# Patient Record
Sex: Female | Born: 1975 | Race: Black or African American | Hispanic: No | Marital: Single | Smoking: Former smoker
Health system: Southern US, Community
[De-identification: ages and names within clinical notes are randomized; demographics above are authoritative.]

## PROBLEM LIST (undated history)

## (undated) DIAGNOSIS — S62309A Unspecified fracture of unspecified metacarpal bone, initial encounter for closed fracture: Secondary | ICD-10-CM

## (undated) HISTORY — PX: TUBAL LIGATION: SHX77

---

## 2015-08-18 ENCOUNTER — Emergency Department (HOSPITAL_COMMUNITY)
Admission: EM | Admit: 2015-08-18 | Discharge: 2015-08-18 | Disposition: A | Discharge status: Home / Self Care | Payer: Self-pay | Attending: Emergency Medicine | Admitting: Emergency Medicine

## 2015-08-18 ENCOUNTER — Emergency Department (HOSPITAL_COMMUNITY): Payer: Self-pay

## 2015-08-18 ENCOUNTER — Encounter (HOSPITAL_COMMUNITY): Payer: Self-pay | Admitting: Emergency Medicine

## 2015-08-18 DIAGNOSIS — S62397A Other fracture of fifth metacarpal bone, left hand, initial encounter for closed fracture: Secondary | ICD-10-CM | POA: Insufficient documentation

## 2015-08-18 DIAGNOSIS — Y998 Other external cause status: Secondary | ICD-10-CM | POA: Insufficient documentation

## 2015-08-18 DIAGNOSIS — F17200 Nicotine dependence, unspecified, uncomplicated: Secondary | ICD-10-CM | POA: Insufficient documentation

## 2015-08-18 DIAGNOSIS — S52501A Unspecified fracture of the lower end of right radius, initial encounter for closed fracture: Secondary | ICD-10-CM

## 2015-08-18 DIAGNOSIS — S52591A Other fractures of lower end of right radius, initial encounter for closed fracture: Secondary | ICD-10-CM | POA: Insufficient documentation

## 2015-08-18 DIAGNOSIS — S62307A Unspecified fracture of fifth metacarpal bone, left hand, initial encounter for closed fracture: Secondary | ICD-10-CM

## 2015-08-18 DIAGNOSIS — Y9289 Other specified places as the place of occurrence of the external cause: Secondary | ICD-10-CM | POA: Insufficient documentation

## 2015-08-18 DIAGNOSIS — Y9389 Activity, other specified: Secondary | ICD-10-CM | POA: Insufficient documentation

## 2015-08-18 DIAGNOSIS — W1839XA Other fall on same level, initial encounter: Secondary | ICD-10-CM | POA: Insufficient documentation

## 2015-08-18 MED ORDER — HYDROCODONE-ACETAMINOPHEN 5-325 MG PO TABS: 1.0000 | ORAL_TABLET | ORAL | Status: AC | PRN

## 2015-08-18 MED ORDER — HYDROCODONE-ACETAMINOPHEN 5-325 MG PO TABS
1.0000 | ORAL_TABLET | Freq: Once | ORAL | Status: AC
Start: 2015-08-18 — End: 2015-08-18
  Administered 2015-08-18: 1 via ORAL
  Filled 2015-08-18: qty 1

## 2015-08-18 MED ORDER — NAPROXEN 250 MG PO TABS
500.0000 mg | ORAL_TABLET | Freq: Once | ORAL | Status: AC
  Administered 2015-08-18: 500 mg via ORAL
  Filled 2015-08-18: qty 2

## 2015-08-18 MED ORDER — NAPROXEN 500 MG PO TABS: 500.0000 mg | ORAL_TABLET | Freq: Two times a day (BID) | ORAL | Status: AC

## 2015-08-18 NOTE — ED Provider Notes (Signed)
CSN: [id number]     Arrival date & time 08/18/15  1331 History  By signing my name below, I, Deborah Townsend, attest that this documentation has been prepared under the direction and in the presence of Deborah Bert, PA-C. Electronically Signed: Octavia Townsend, ED Scribe. 08/18/2015. 2:55 PM.    Chief Complaint  Patient presents with  . Wrist Pain  . Hand Pain      The history is provided by the patient. No language interpreter was used.   HPI Comments: Deborah Townsend is a 40 y.o. female who presents to the Emergency Department complaining of sudden onset, moderate, gradual worsening, left hand pain and right wrist pain with associated swelling onset 3 days ago. Pt reports she was running and fell with her arms outstretched breaking her fall. Pt expresses increased pain when turning her right wrist. She has not taken any medication to alleviate her pain. Pt has been applying ice to both of her hands to alleviate her swelling with no relief. Pt is currently wearing a right wrist brace. She denies hitting her head or loss of consciousness.  History reviewed. No pertinent past medical history. History reviewed. No pertinent past surgical history. No family history on file. Social History  Substance Use Topics  . Smoking status: Current Some Day Smoker  . Smokeless tobacco: None  . Alcohol Use: Yes     Comment: social   OB History    No data available     Review of Systems  A complete 10 system review of systems was obtained and all systems are negative except as noted in the HPI and PMH.    Allergies  Review of patient's allergies indicates no known allergies.  Home Medications   Prior to Admission medications   Not on File   Triage vitals: BP 131/84 mmHg  Pulse 73  Temp(Src) 98.2 F (36.8 C) (Oral)  Resp 20  SpO2 99%  LMP 07/19/2015 (Exact Date) Physical Exam  Constitutional: She is oriented to person, place, and time. She appears well-developed and well-nourished.  HENT:   Head: Normocephalic.  Eyes: EOM are normal.  Neck: Normal range of motion.  Pulmonary/Chest: Effort normal.  Abdominal: She exhibits no distension.  Musculoskeletal: Normal range of motion. She exhibits tenderness.  Radial side thumb through wrist ttp. Bruising of radial side of right wrist. 2+ distal pulses. Limited wrist ROM 2/2 pain. Brisk cap refill x 5  Left hand with tenderness at base of fifth finger. No discoloration. Brisk cap refill.   Neurological: She is alert and oriented to person, place, and time.  Psychiatric: She has a normal mood and affect.  Nursing note and vitals reviewed.   ED Course  Procedures  DIAGNOSTIC STUDIES: Oxygen Saturation is 99% on RA, normal by my interpretation.  COORDINATION OF CARE:  2:55 PM Discussed treatment plan with pt at bedside and pt agreed to plan.  Labs Review Labs Reviewed - No data to display  Imaging Review Dg Wrist Complete Right  08/18/2015  CLINICAL DATA:  Acute left wrist pain and swelling after fall 2 days ago. EXAM: RIGHT WRIST - COMPLETE 3+ VIEW COMPARISON:  None. FINDINGS: Minimally displaced fracture is seen involving the distal right radius with probable intra-articular extension. This appears to be closed and posttraumatic. Joint spaces appear to be intact. No other bony abnormality is noted. IMPRESSION: Minimally displaced distal right radial fracture with probable intra-articular extension. Electronically Signed   By: Lupita Raider, M.D.   On: 08/18/2015 14:47  Dg Hand Complete Left  08/18/2015  CLINICAL DATA:  Acute left hand pain and swelling after fall 2 days ago. Initial encounter. EXAM: LEFT HAND - COMPLETE 3+ VIEW COMPARISON:  None. FINDINGS: Mildly displaced and possibly comminuted oblique fracture is seen involving the fifth metacarpal. This appears to be closed and posttraumatic. No other fracture or bony abnormality is noted. Joint spaces appear to be intact. No soft tissue abnormality is noted. IMPRESSION:  Mildly displaced and possibly comminuted fifth metacarpal fracture. Electronically Signed   By: Lupita RaiderJames  Green Jr, M.D.   On: 08/18/2015 14:45   I have personally reviewed and evaluated these images and lab results as part of my medical decision-making.   EKG Interpretation None      MDM   Final diagnoses:  Fracture of fifth metacarpal bone of left hand, closed, initial encounter  Distal radial fracture, right, closed, initial encounter    X-rays reveal a left fifth metacarpal fracture and right distal radial fracture. I spoke with Dr. Mina MarbleWeingold who advised splinting both extremities and f/u in clinic with him either tomorrow or Thursday. Appreciate recs. Splints placed by ortho techs. Pain controlled in the ED. Rx given for pain meds. ER return precautions given.  I personally performed the services described in this documentation, which was scribed in my presence. The recorded information has been reviewed and is accurate.   Deborah CoriaSerena Y [name], PA-C 08/18/15 1730  Pricilla LovelessScott Goldston, MD 08/19/15 [phone]

## 2015-08-18 NOTE — ED Notes (Signed)
Ortho tech called for splints

## 2015-08-18 NOTE — Progress Notes (Signed)
Orthopedic Tech Progress Note Patient Details:  Deborah Townsend 05-13-1975 [id number]  Ortho Devices Type of Ortho Device: Ace wrap, Arm sling, Sugartong splint, Ulna gutter splint Ortho Device/Splint Location: sugartong RUE, ulna gutter LUE Ortho Device/Splint Interventions: Ordered, Application   Jennye MoccasinHughes, Cris Gibby Craig 08/18/2015, 4:03 PM

## 2015-08-18 NOTE — ED Notes (Signed)
EDP at bedside  

## 2015-08-18 NOTE — ED Notes (Signed)
Pt reports that she fell Saturday and has had right wrist pain and left hand pain since. Pt has swelling to the left hand and a brace on the right wrist.

## 2015-08-18 NOTE — Discharge Instructions (Signed)
Please call Dr. Ronie SpiesWeingold's office to schedule a follow up appointment. I will give you a couple prescriptions to help with your pain in the meantime.

## 2015-08-18 NOTE — ED Notes (Signed)
Pt. Transported to xray at this time.  

## 2015-08-18 NOTE — ED Notes (Signed)
Ortho tech in room applying splints at this time

## 2015-08-21 ENCOUNTER — Other Ambulatory Visit: Payer: Self-pay | Admitting: Orthopedic Surgery

## 2015-08-22 ENCOUNTER — Encounter (HOSPITAL_BASED_OUTPATIENT_CLINIC_OR_DEPARTMENT_OTHER): Payer: Self-pay | Admitting: *Deleted

## 2015-08-27 ENCOUNTER — Encounter (HOSPITAL_BASED_OUTPATIENT_CLINIC_OR_DEPARTMENT_OTHER)
Admission: RE | Disposition: A | Discharge status: Home / Self Care | Payer: Self-pay | Source: Ambulatory Visit | Attending: Orthopedic Surgery

## 2015-08-27 ENCOUNTER — Ambulatory Visit (HOSPITAL_BASED_OUTPATIENT_CLINIC_OR_DEPARTMENT_OTHER): Payer: MEDICAID | Admitting: Anesthesiology

## 2015-08-27 ENCOUNTER — Ambulatory Visit (HOSPITAL_BASED_OUTPATIENT_CLINIC_OR_DEPARTMENT_OTHER)
Admission: RE | Admit: 2015-08-27 | Discharge: 2015-08-27 | Disposition: A | Discharge status: Home / Self Care | Payer: Self-pay | Source: Ambulatory Visit | Attending: Orthopedic Surgery | Admitting: Orthopedic Surgery

## 2015-08-27 ENCOUNTER — Encounter (HOSPITAL_BASED_OUTPATIENT_CLINIC_OR_DEPARTMENT_OTHER): Payer: Self-pay | Admitting: Anesthesiology

## 2015-08-27 DIAGNOSIS — X58XXXA Exposure to other specified factors, initial encounter: Secondary | ICD-10-CM | POA: Insufficient documentation

## 2015-08-27 DIAGNOSIS — Z87891 Personal history of nicotine dependence: Secondary | ICD-10-CM | POA: Insufficient documentation

## 2015-08-27 DIAGNOSIS — S62307A Unspecified fracture of fifth metacarpal bone, left hand, initial encounter for closed fracture: Secondary | ICD-10-CM | POA: Insufficient documentation

## 2015-08-27 HISTORY — PX: OPEN REDUCTION INTERNAL FIXATION (ORIF) METACARPAL: SHX6234

## 2015-08-27 HISTORY — DX: Unspecified fracture of unspecified metacarpal bone, initial encounter for closed fracture: S62.309A

## 2015-08-27 SURGERY — OPEN REDUCTION INTERNAL FIXATION (ORIF) METACARPAL
Anesthesia: General | Site: Hand | Laterality: Left

## 2015-08-27 MED ORDER — CEFAZOLIN SODIUM-DEXTROSE 2-4 GM/100ML-% IV SOLN
INTRAVENOUS | Status: AC
  Filled 2015-08-27: qty 100

## 2015-08-27 MED ORDER — SCOPOLAMINE 1 MG/3DAYS TD PT72
1.0000 | MEDICATED_PATCH | Freq: Once | TRANSDERMAL | Status: DC | PRN
Start: 2015-08-27 — End: 2015-08-27

## 2015-08-27 MED ORDER — FENTANYL CITRATE (PF) 100 MCG/2ML IJ SOLN
INTRAMUSCULAR | Status: DC | PRN
  Administered 2015-08-27 (×6): 50 ug via INTRAVENOUS

## 2015-08-27 MED ORDER — FENTANYL CITRATE (PF) 100 MCG/2ML IJ SOLN
INTRAMUSCULAR | Status: AC
  Filled 2015-08-27: qty 2

## 2015-08-27 MED ORDER — DEXAMETHASONE SODIUM PHOSPHATE 4 MG/ML IJ SOLN
INTRAMUSCULAR | Status: DC | PRN
  Administered 2015-08-27: 10 mg via INTRAVENOUS

## 2015-08-27 MED ORDER — MIDAZOLAM HCL 5 MG/5ML IJ SOLN
INTRAMUSCULAR | Status: DC | PRN
  Administered 2015-08-27: 2 mg via INTRAVENOUS

## 2015-08-27 MED ORDER — FENTANYL CITRATE (PF) 100 MCG/2ML IJ SOLN
25.0000 ug | INTRAMUSCULAR | Status: DC | PRN
  Administered 2015-08-27 (×2): 50 ug via INTRAVENOUS

## 2015-08-27 MED ORDER — PROMETHAZINE HCL 25 MG/ML IJ SOLN: 6.2500 mg | INTRAMUSCULAR | Status: DC | PRN

## 2015-08-27 MED ORDER — FENTANYL CITRATE (PF) 100 MCG/2ML IJ SOLN: 50.0000 ug | INTRAMUSCULAR | Status: DC | PRN

## 2015-08-27 MED ORDER — CEFAZOLIN SODIUM-DEXTROSE 2-4 GM/100ML-% IV SOLN
2.0000 g | INTRAVENOUS | Status: AC
  Administered 2015-08-27: 2 g via INTRAVENOUS

## 2015-08-27 MED ORDER — DEXAMETHASONE SODIUM PHOSPHATE 10 MG/ML IJ SOLN
INTRAMUSCULAR | Status: AC
  Filled 2015-08-27: qty 1

## 2015-08-27 MED ORDER — OXYCODONE-ACETAMINOPHEN 5-325 MG PO TABS: 1.0000 | ORAL_TABLET | ORAL | Status: AC | PRN

## 2015-08-27 MED ORDER — MIDAZOLAM HCL 2 MG/2ML IJ SOLN: 1.0000 mg | INTRAMUSCULAR | Status: DC | PRN

## 2015-08-27 MED ORDER — OXYCODONE HCL 5 MG PO TABS
ORAL_TABLET | ORAL | Status: AC
  Filled 2015-08-27: qty 1

## 2015-08-27 MED ORDER — LIDOCAINE 2% (20 MG/ML) 5 ML SYRINGE
INTRAMUSCULAR | Status: AC
  Filled 2015-08-27: qty 5

## 2015-08-27 MED ORDER — LIDOCAINE HCL (PF) 1 % IJ SOLN
INTRAMUSCULAR | Status: AC
  Filled 2015-08-27: qty 30

## 2015-08-27 MED ORDER — ONDANSETRON HCL 4 MG/2ML IJ SOLN
INTRAMUSCULAR | Status: AC
  Filled 2015-08-27: qty 2

## 2015-08-27 MED ORDER — ONDANSETRON HCL 4 MG/2ML IJ SOLN
INTRAMUSCULAR | Status: DC | PRN
  Administered 2015-08-27: 4 mg via INTRAVENOUS

## 2015-08-27 MED ORDER — BUPIVACAINE HCL (PF) 0.25 % IJ SOLN
INTRAMUSCULAR | Status: AC
  Filled 2015-08-27: qty 30

## 2015-08-27 MED ORDER — GLYCOPYRROLATE 0.2 MG/ML IJ SOLN: 0.2000 mg | Freq: Once | INTRAMUSCULAR | Status: DC | PRN

## 2015-08-27 MED ORDER — PROPOFOL 10 MG/ML IV BOLUS
INTRAVENOUS | Status: AC
  Filled 2015-08-27: qty 40

## 2015-08-27 MED ORDER — LIDOCAINE HCL (CARDIAC) 20 MG/ML IV SOLN
INTRAVENOUS | Status: DC | PRN
  Administered 2015-08-27: 100 mg via INTRAVENOUS

## 2015-08-27 MED ORDER — MIDAZOLAM HCL 2 MG/2ML IJ SOLN
INTRAMUSCULAR | Status: AC
  Filled 2015-08-27: qty 2

## 2015-08-27 MED ORDER — LACTATED RINGERS IV SOLN
INTRAVENOUS | Status: DC
  Administered 2015-08-27 (×2): via INTRAVENOUS

## 2015-08-27 MED ORDER — BUPIVACAINE HCL (PF) 0.25 % IJ SOLN
INTRAMUSCULAR | Status: DC | PRN
  Administered 2015-08-27: 10 mL

## 2015-08-27 MED ORDER — CHLORHEXIDINE GLUCONATE 4 % EX LIQD: 60.0000 mL | Freq: Once | CUTANEOUS | Status: DC

## 2015-08-27 MED ORDER — OXYCODONE HCL 5 MG PO TABS
5.0000 mg | ORAL_TABLET | Freq: Once | ORAL | Status: AC
  Administered 2015-08-27: 5 mg via ORAL

## 2015-08-27 MED ORDER — PROPOFOL 10 MG/ML IV BOLUS
INTRAVENOUS | Status: DC | PRN
  Administered 2015-08-27: 150 mg via INTRAVENOUS
  Administered 2015-08-27: 50 mg via INTRAVENOUS

## 2015-08-27 SURGICAL SUPPLY — 76 items
APL SKNCLS STERI-STRIP NONHPOA (GAUZE/BANDAGES/DRESSINGS) ×1
BANDAGE ACE 3X5.8 VEL STRL LF (GAUZE/BANDAGES/DRESSINGS) ×3 IMPLANT
BANDAGE ACE 4X5 VEL STRL LF (GAUZE/BANDAGES/DRESSINGS) IMPLANT
BENZOIN TINCTURE PRP APPL 2/3 (GAUZE/BANDAGES/DRESSINGS) ×3 IMPLANT
BIT DRILL 1.1 MINI (BIT) ×1 IMPLANT
BLADE SURG 15 STRL LF DISP TIS (BLADE) ×1 IMPLANT
BLADE SURG 15 STRL SS (BLADE) ×3
BNDG CMPR 9X4 STRL LF SNTH (GAUZE/BANDAGES/DRESSINGS) ×1
BNDG ELASTIC 2X5.8 VLCR STR LF (GAUZE/BANDAGES/DRESSINGS) ×3 IMPLANT
BNDG ESMARK 4X9 LF (GAUZE/BANDAGES/DRESSINGS) ×3 IMPLANT
BNDG GAUZE ELAST 4 BULKY (GAUZE/BANDAGES/DRESSINGS) ×3 IMPLANT
CANISTER SUCT 1200ML W/VALVE (MISCELLANEOUS) IMPLANT
CLOSURE WOUND 1/2 X4 (GAUZE/BANDAGES/DRESSINGS) ×1
CORDS BIPOLAR (ELECTRODE) ×3 IMPLANT
COVER BACK TABLE 60X90IN (DRAPES) ×3 IMPLANT
CUFF TOURNIQUET SINGLE 18IN (TOURNIQUET CUFF) ×3 IMPLANT
DECANTER SPIKE VIAL GLASS SM (MISCELLANEOUS) IMPLANT
DRAPE EXTREMITY T 121X128X90 (DRAPE) ×3 IMPLANT
DRAPE OEC MINIVIEW 54X84 (DRAPES) ×3 IMPLANT
DRAPE SURG 17X23 STRL (DRAPES) ×3 IMPLANT
DRILL BIT 1.1 MINI (BIT) ×3
DURAPREP 26ML APPLICATOR (WOUND CARE) ×3 IMPLANT
GAUZE SPONGE 4X4 12PLY STRL (GAUZE/BANDAGES/DRESSINGS) ×3 IMPLANT
GAUZE SPONGE 4X4 16PLY XRAY LF (GAUZE/BANDAGES/DRESSINGS) IMPLANT
GAUZE XEROFORM 1X8 LF (GAUZE/BANDAGES/DRESSINGS) IMPLANT
GLOVE BIOGEL PI IND STRL 7.0 (GLOVE) ×1 IMPLANT
GLOVE BIOGEL PI IND STRL 7.5 (GLOVE) ×1 IMPLANT
GLOVE BIOGEL PI INDICATOR 7.0 (GLOVE) ×2
GLOVE BIOGEL PI INDICATOR 7.5 (GLOVE) ×2
GLOVE ECLIPSE 6.5 STRL STRAW (GLOVE) ×3 IMPLANT
GLOVE SURG SYN 8.0 (GLOVE) ×3 IMPLANT
GOWN STRL REUS W/ TWL LRG LVL3 (GOWN DISPOSABLE) ×1 IMPLANT
GOWN STRL REUS W/TWL LRG LVL3 (GOWN DISPOSABLE) ×3
GOWN STRL REUS W/TWL XL LVL3 (GOWN DISPOSABLE) ×6 IMPLANT
NEEDLE HYPO 25X1 1.5 SAFETY (NEEDLE) ×3 IMPLANT
NS IRRIG 1000ML POUR BTL (IV SOLUTION) ×3 IMPLANT
PACK BASIN DAY SURGERY FS (CUSTOM PROCEDURE TRAY) ×3 IMPLANT
PAD CAST 3X4 CTTN HI CHSV (CAST SUPPLIES) ×1 IMPLANT
PAD CAST 4YDX4 CTTN HI CHSV (CAST SUPPLIES) IMPLANT
PADDING CAST ABS 4INX4YD NS (CAST SUPPLIES) ×2
PADDING CAST ABS COTTON 4X4 ST (CAST SUPPLIES) ×1 IMPLANT
PADDING CAST COTTON 3X4 STRL (CAST SUPPLIES) ×3
PADDING CAST COTTON 4X4 STRL (CAST SUPPLIES)
PADDING UNDERCAST 2 STRL (CAST SUPPLIES) ×2
PADDING UNDERCAST 2X4 STRL (CAST SUPPLIES) ×1 IMPLANT
PLATE T 1.5 3H HD/8H SFT (Plate) ×1 IMPLANT
PLATE-T 1.5 3H HD/8H SFT (Plate) ×3 IMPLANT
SCREW CORTEX 1.5X10 (Screw) ×3 IMPLANT
SCREW CORTEX 1.5X11 (Screw) ×3 IMPLANT
SCREW CORTEX 1.5X12 (Screw) ×6 IMPLANT
SCREW CORTEX 1.5X14 (Screw) ×6 IMPLANT
SCREW SELF TAP CORTEX 1.5 13MM (Screw) ×3 IMPLANT
SHEET MEDIUM DRAPE 40X70 STRL (DRAPES) ×3 IMPLANT
SLEEVE SCD COMPRESS KNEE MED (MISCELLANEOUS) ×3 IMPLANT
SPLINT PLASTER CAST XFAST 4X15 (CAST SUPPLIES) ×15 IMPLANT
SPLINT PLASTER XTRA FAST SET 4 (CAST SUPPLIES) ×30
STOCKINETTE 4X48 STRL (DRAPES) ×3 IMPLANT
STRIP CLOSURE SKIN 1/2X4 (GAUZE/BANDAGES/DRESSINGS) ×2 IMPLANT
SUCTION FRAZIER HANDLE 10FR (MISCELLANEOUS)
SUCTION TUBE FRAZIER 10FR DISP (MISCELLANEOUS) IMPLANT
SUT ETHILON 4 0 PS 2 18 (SUTURE) IMPLANT
SUT ETHILON 5 0 PS 2 18 (SUTURE) IMPLANT
SUT MERSILENE 4 0 P 3 (SUTURE) IMPLANT
SUT VIC AB 2-0 CT3 27 (SUTURE) ×3 IMPLANT
SUT VIC AB 4-0 P-3 18XBRD (SUTURE) IMPLANT
SUT VIC AB 4-0 P3 18 (SUTURE)
SUT VICRYL 3-0 RB1 (SUTURE) IMPLANT
SUT VICRYL 4-0 PS2 18IN ABS (SUTURE) ×3 IMPLANT
SUT VICRYL RAPIDE 4-0 (SUTURE) IMPLANT
SUT VICRYL RAPIDE 4/0 PS 2 (SUTURE) IMPLANT
SYR BULB 3OZ (MISCELLANEOUS) ×3 IMPLANT
SYRINGE 10CC LL (SYRINGE) ×3 IMPLANT
TOWEL OR 17X24 6PK STRL BLUE (TOWEL DISPOSABLE) ×3 IMPLANT
TUBE CONNECTING 20'X1/4 (TUBING)
TUBE CONNECTING 20X1/4 (TUBING) IMPLANT
UNDERPAD 30X30 (UNDERPADS AND DIAPERS) ×3 IMPLANT

## 2015-08-27 NOTE — Anesthesia Preprocedure Evaluation (Addendum)
Anesthesia Evaluation  Patient identified by MRN, date of birth, ID band Patient awake    Reviewed: Allergy & Precautions, NPO status , Patient's Chart, lab work & pertinent test results  History of Anesthesia Complications Negative for: history of anesthetic complications  Airway Mallampati: I  TM Distance: >3 FB Neck ROM: Full    Dental  (+) Teeth Intact, Dental Advisory Given   Pulmonary former smoker,    Pulmonary exam normal breath sounds clear to auscultation       Cardiovascular Exercise Tolerance: Good (-) hypertensionnegative cardio ROS Normal cardiovascular exam Rhythm:Regular Rate:Normal     Neuro/Psych negative neurological ROS  negative psych ROS   GI/Hepatic negative GI ROS, Neg liver ROS,   Endo/Other  negative endocrine ROS  Renal/GU negative Renal ROS     Musculoskeletal negative musculoskeletal ROS (+)   Abdominal   Peds  Hematology negative hematology ROS (+)   Anesthesia Other Findings Day of surgery medications reviewed with the patient.  Reproductive/Obstetrics negative OB ROS                           Anesthesia Physical Anesthesia Plan  ASA: I  Anesthesia Plan: General   Post-op Pain Management:    Induction: Intravenous  Airway Management Planned: LMA  Additional Equipment:   Intra-op Plan:   Post-operative Plan: Extubation in OR  Informed Consent: I have reviewed the patients History and Physical, chart, labs and discussed the procedure including the risks, benefits and alternatives for the proposed anesthesia with the patient or authorized representative who has indicated his/her understanding and acceptance.   Dental advisory given  Plan Discussed with: CRNA  Anesthesia Plan Comments: (Risks/benefits of general anesthesia discussed with patient including risk of damage to teeth, lips, gum, and tongue, nausea/vomiting, allergic reactions to  medications, and the possibility of heart attack, stroke and death.  All patient questions answered.  Patient wishes to proceed.)        Anesthesia Quick Evaluation

## 2015-08-27 NOTE — Anesthesia Postprocedure Evaluation (Signed)
Anesthesia Post Note  Patient: Deborah Townsend  Procedure(s) Performed: Procedure(s) (LRB): OPEN REDUCTION INTERNAL FIXATION (ORIF) LEFT SMALL METACARPAL FRACTURE (Left)  Patient location during evaluation: PACU Anesthesia Type: General Level of consciousness: awake and alert Pain management: pain level controlled Vital Signs Assessment: post-procedure vital signs reviewed and stable Respiratory status: spontaneous breathing, nonlabored ventilation, respiratory function stable and patient connected to nasal cannula oxygen Cardiovascular status: blood pressure returned to baseline and stable Postop Assessment: no signs of nausea or vomiting Anesthetic complications: no    Last Vitals:  Filed Vitals:   08/27/15 1445 08/27/15 1516  BP: 135/102 142/85  Pulse: 75 76  Temp:  36.6 C  Resp: 9 18    Last Pain:  Filed Vitals:   08/27/15 1517  PainSc: 4                  Cecile HearingStephen Edward Turk

## 2015-08-27 NOTE — Op Note (Signed)
See note [phone]

## 2015-08-27 NOTE — Anesthesia Procedure Notes (Signed)
Procedure Name: LMA Insertion Date/Time: 08/27/2015 12:22 PM Performed by: Genevieve NorlanderLINKA, Rhya Shan L Pre-anesthesia Checklist: Patient identified, Emergency Drugs available, Suction available, Patient being monitored and Timeout performed Patient Re-evaluated:Patient Re-evaluated prior to inductionOxygen Delivery Method: Circle system utilized Preoxygenation: Pre-oxygenation with 100% oxygen Intubation Type: IV induction Ventilation: Mask ventilation without difficulty LMA: LMA inserted LMA Size: 4.0 Number of attempts: 1 Airway Equipment and Method: Bite block Placement Confirmation: positive ETCO2 Tube secured with: Tape Dental Injury: Teeth and Oropharynx as per pre-operative assessment

## 2015-08-27 NOTE — Transfer of Care (Signed)
Immediate Anesthesia Transfer of Care Note  Patient: Deborah Townsend  Procedure(s) Performed: Procedure(s): OPEN REDUCTION INTERNAL FIXATION (ORIF) LEFT SMALL METACARPAL FRACTURE (Left)  Patient Location: PACU  Anesthesia Type:General  Level of Consciousness: awake and patient cooperative  Airway & Oxygen Therapy: Patient Spontanous Breathing and Patient connected to face mask oxygen  Post-op Assessment: Report given to RN and Post -op Vital signs reviewed and stable  Post vital signs: Reviewed and stable  Last Vitals:  Filed Vitals:   08/27/15 1109  BP: 129/77  Pulse: 79  Temp: 36.8 C  Resp: 18    Last Pain:  Filed Vitals:   08/27/15 1110  PainSc: 3       Patients Stated Pain Goal: 3 (08/27/15 1109)  Complications: No apparent anesthesia complications

## 2015-08-27 NOTE — H&P (Signed)
Deborah Townsend is an 40 y.o. female.   Chief Complaint: left hand pain and swelling HPI: as above s/p left hand trauma with displaced small metacarpal fracture  Past Medical History  Diagnosis Date  . Metacarpal bone fracture     left small finger    Past Surgical History  Procedure Laterality Date  . Tubal ligation      History reviewed. No pertinent family history. Social History:  reports that she has quit smoking. She does not have any smokeless tobacco history on file. She reports that she drinks alcohol. She reports that she does not use illicit drugs.  Allergies: No Known Allergies  Medications Prior to Admission  Medication Sig Dispense Refill  . HYDROcodone-acetaminophen (NORCO/VICODIN) 5-325 MG tablet Take 1 tablet by mouth every 4 (four) hours as needed for severe pain. 10 tablet 0  . naproxen (NAPROSYN) 500 MG tablet Take 1 tablet (500 mg total) by mouth 2 (two) times daily. 30 tablet 0    No results found for this or any previous visit (from the past 48 hour(s)). No results found.  Review of Systems  All other systems reviewed and are negative.   Blood pressure 129/77, pulse 79, temperature 98.2 F (36.8 C), temperature source Oral, resp. rate 18, height 5\' 9"  (1.753 m), weight 76.204 kg (168 lb), last menstrual period 07/19/2015, SpO2 100 %. Physical Exam  Constitutional: She is oriented to person, place, and time. She appears well-developed and well-nourished.  HENT:  Head: Normocephalic and atraumatic.  Neck: Normal range of motion.  Cardiovascular: Normal rate.   Respiratory: Effort normal.  Musculoskeletal:       Left hand: She exhibits tenderness, bony tenderness, deformity and swelling.  Dorsal swelling and deformity to left small metacarpal  Neurological: She is alert and oriented to person, place, and time.  Skin: Skin is warm.  Psychiatric: She has a normal mood and affect. Her behavior is normal. Judgment and thought content normal.      Assessment/Plan As above  Plan ORIF above  Dairl PonderWEINGOLD,Maryella Abood A, MD 08/27/2015, 12:07 PM

## 2015-08-27 NOTE — Discharge Instructions (Signed)

## 2015-08-28 ENCOUNTER — Encounter (HOSPITAL_BASED_OUTPATIENT_CLINIC_OR_DEPARTMENT_OTHER): Payer: Self-pay | Admitting: Orthopedic Surgery

## 2015-08-28 NOTE — Op Note (Signed)
NAME:  Deborah Townsend, Deborah Townsend              ACCOUNT NO.:  [id number]  MEDICAL RECORD NO.:  [id number]  LOCATION:                                 FACILITY:  PHYSICIAN:  Artist PaisMatthew A. Nikko Quast, M.D.DATE OF BIRTH:  January 07, 1976  DATE OF PROCEDURE:  08/27/2015 DATE OF DISCHARGE:                              OPERATIVE REPORT   PREOPERATIVE DIAGNOSIS:  Displaced intra-articular fracture left small metacarpal.  POSTOPERATIVE DIAGNOSIS:  Displaced intra-articular fracture left small metacarpal.  PROCEDURE:  Open reduction and internal fixation of displaced intra- articular fracture of metacarpal distal third, left small finger.  SURGEON:  Artist PaisMatthew A. Mina MarbleWeingold, M.D.  ASSISTANT:  None.  ANESTHESIA:  General.  COMPLICATIONS:  None.  DRAINS:  None.  DESCRIPTION OF PROCEDURE:  The patient was taken to the operative suite. After induction of adequate general anesthetic, the left upper extremity was prepped and draped in sterile fashion.  An Esmarch was used to exsanguinate the limb.  Tourniquet was inflated to 250 mmHg.  At this point in time, an incision was made over the dorsal aspect of the left small finger starting at the metacarpophalangeal joint approximately 3-4 cm.  Dissection was carried down through the skin and subcutaneous tissues.  The interval between the extensor digitorum communis to the small finger and extensor digiti minimi to the small finger was identified and split.  Dissection was carried down between these two. Dissection of the fracture site revealed an articular fracture with a split in the head up to the joint level with a butterfly fragment palmarly as well.  We reduced the fracture with longitudinal traction and held it with a reduction clamp.  Two lag screws were placed from ulnar to radial across the fracture site.  We then used these screws to help maintain reduction while placing a dorsal T-plate with 3 screws distal to the fracture site into the metacarpal head  area and 4 proximal.  These screws were placed under direct fluoroscopic guidance. One of the screws had backed out from the lag screws which was removed. After this was completed, we used a 0 Vicryl cerclage suture to free up a large volar butterfly fragment.  Intraoperative fluoroscopy then revealed adequate reduction in AP, lateral and oblique views.  The wound was irrigated and loosely closed in layers of 2-0 Vicryl to cover the plate and 3-0 Prolene subcuticular stitch on the skin.  Steri-Strips, 4x4s, fluffs, and an ulnar gutter splint was applied.  The patient tolerated the procedure well and went to the recovery room in stable fashion.     Artist PaisMatthew A. Mina MarbleWeingold, M.D.   ______________________________ Artist PaisMatthew A. Mina MarbleWeingold, M.D.    MAW/MEDQ  D:  08/27/2015  T:  08/27/2015  Job:  [id number]

## 2016-10-25 IMAGING — CR DG HAND COMPLETE 3+V*L*
3 series · 3 of 3 positions shown · non-contrast
Comparison: None.

CLINICAL DATA: Acute left hand pain and swelling after fall 2 days
ago. Initial encounter.

EXAM:
LEFT HAND - COMPLETE 3+ VIEW

[hand pa]
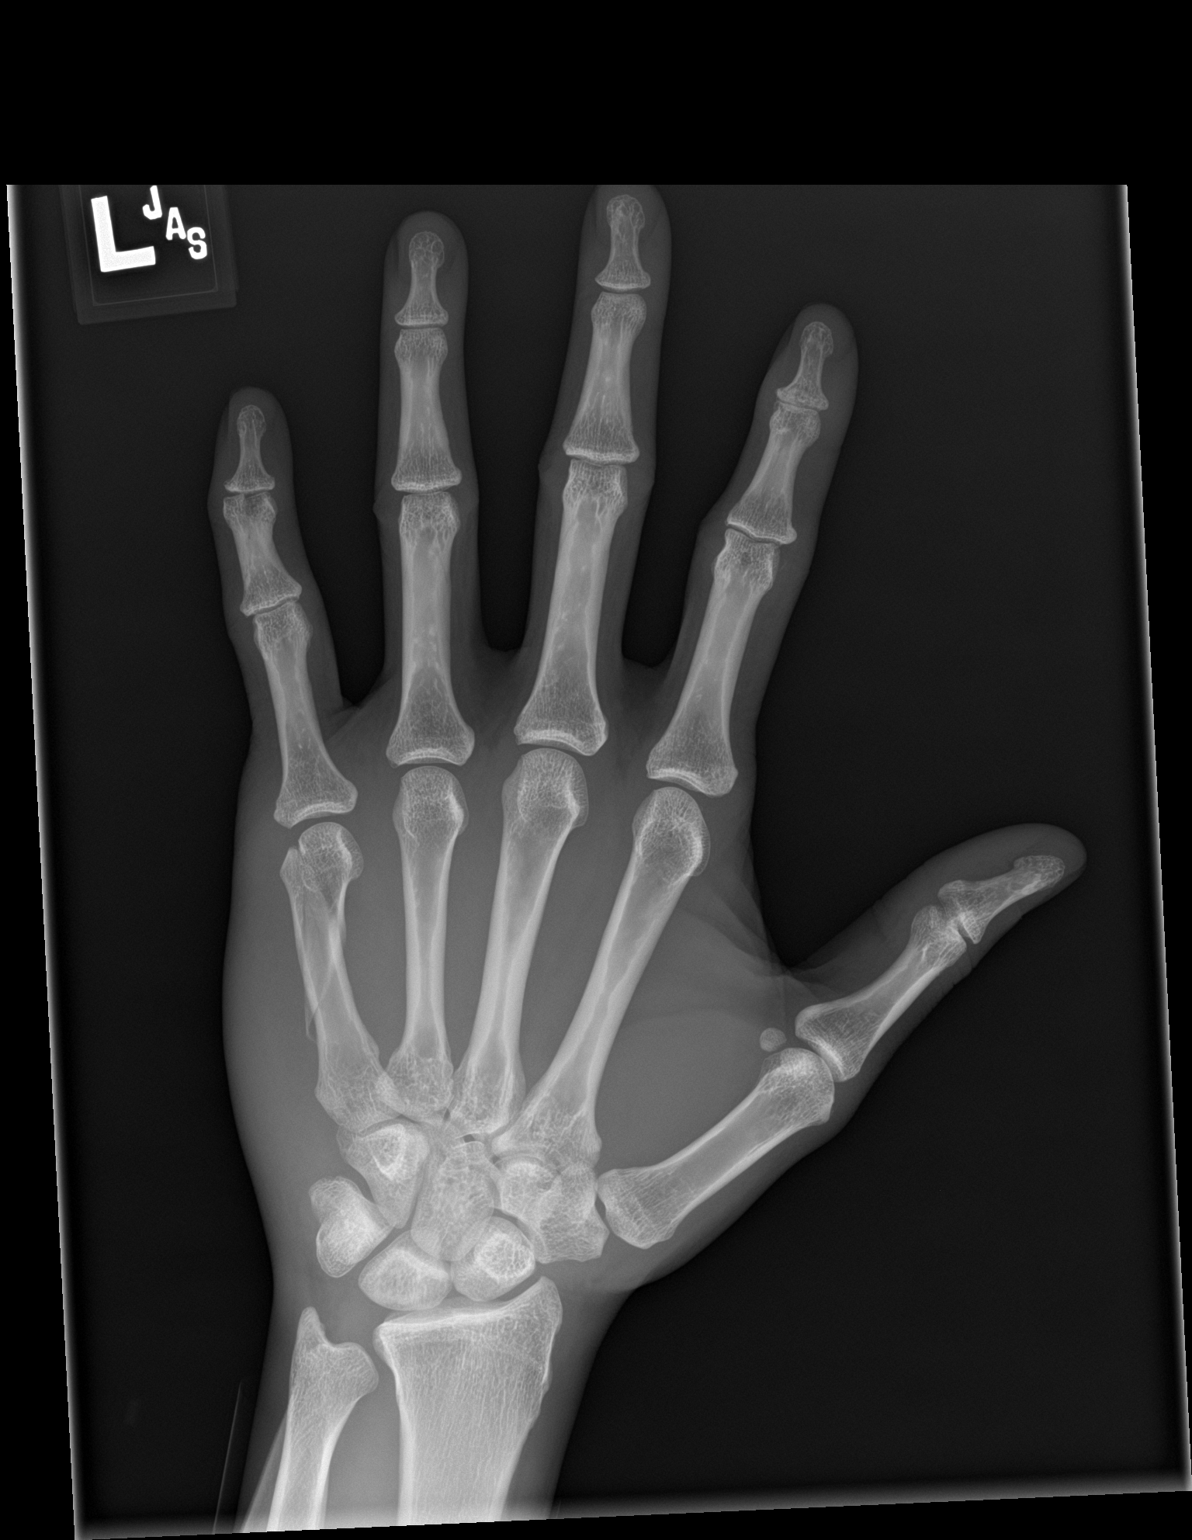

[hand obl]
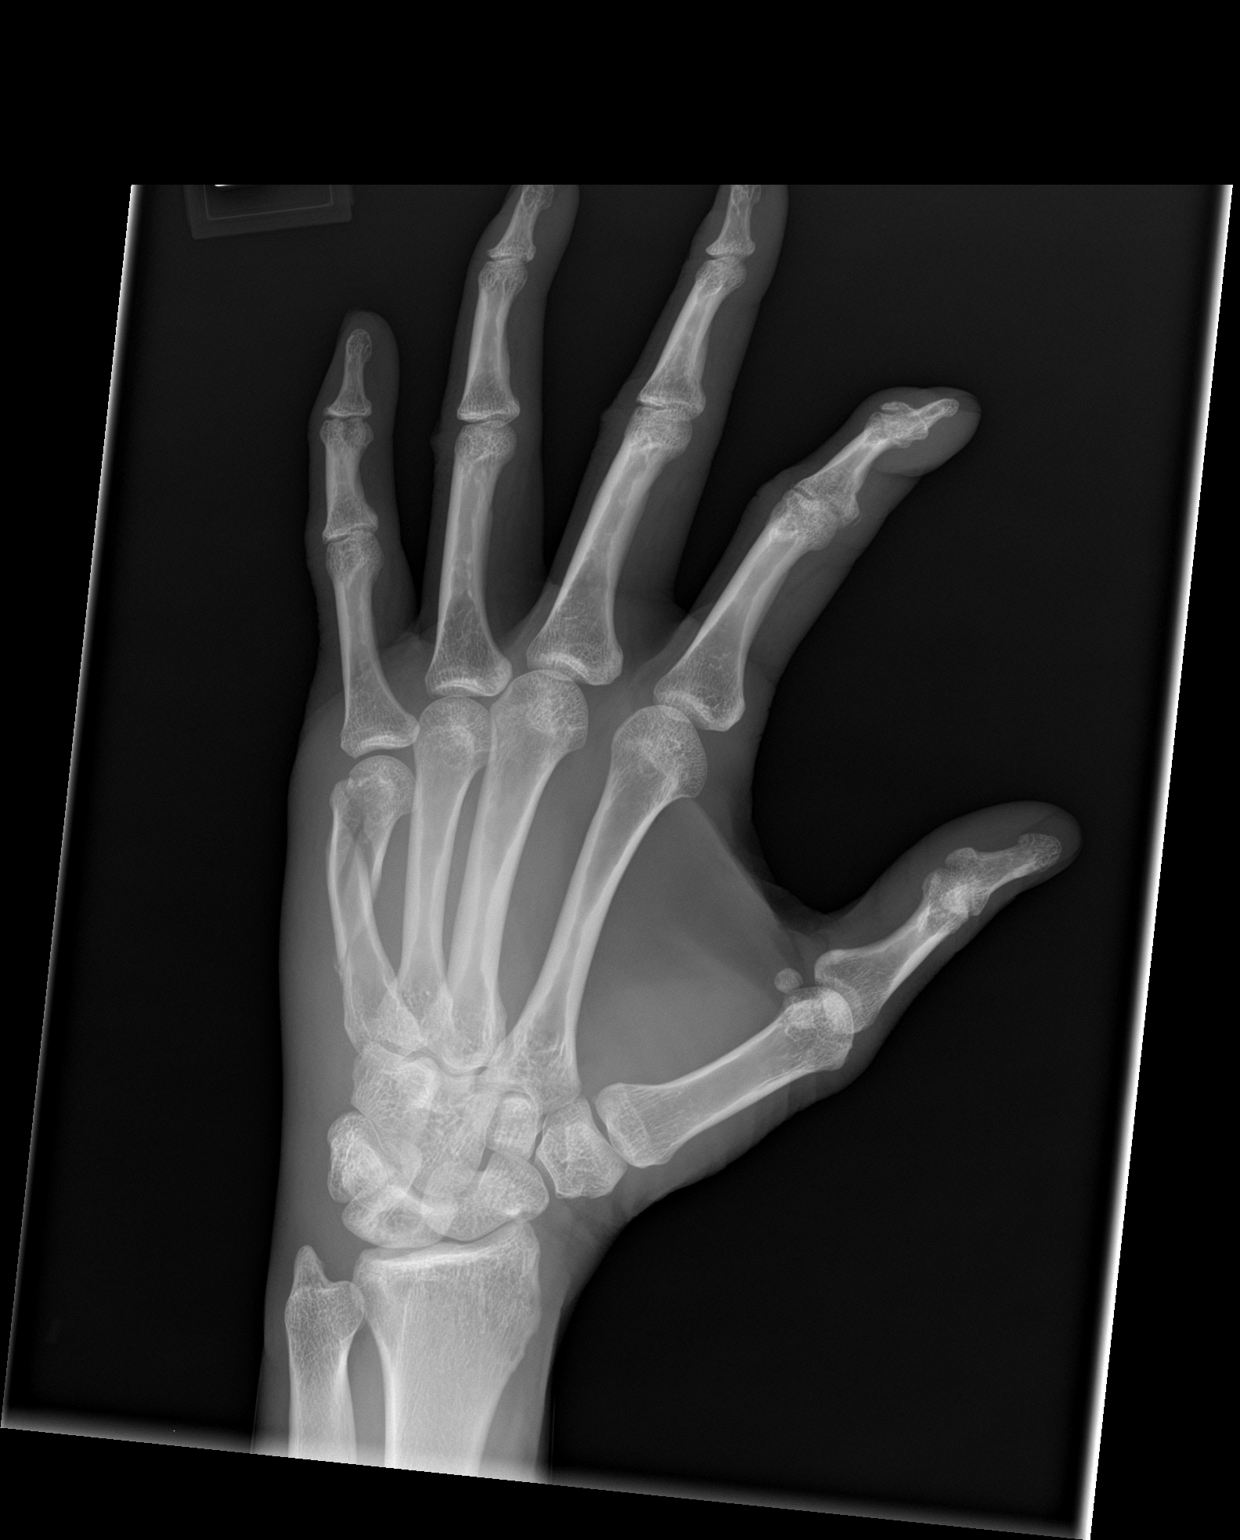

[hand lat]
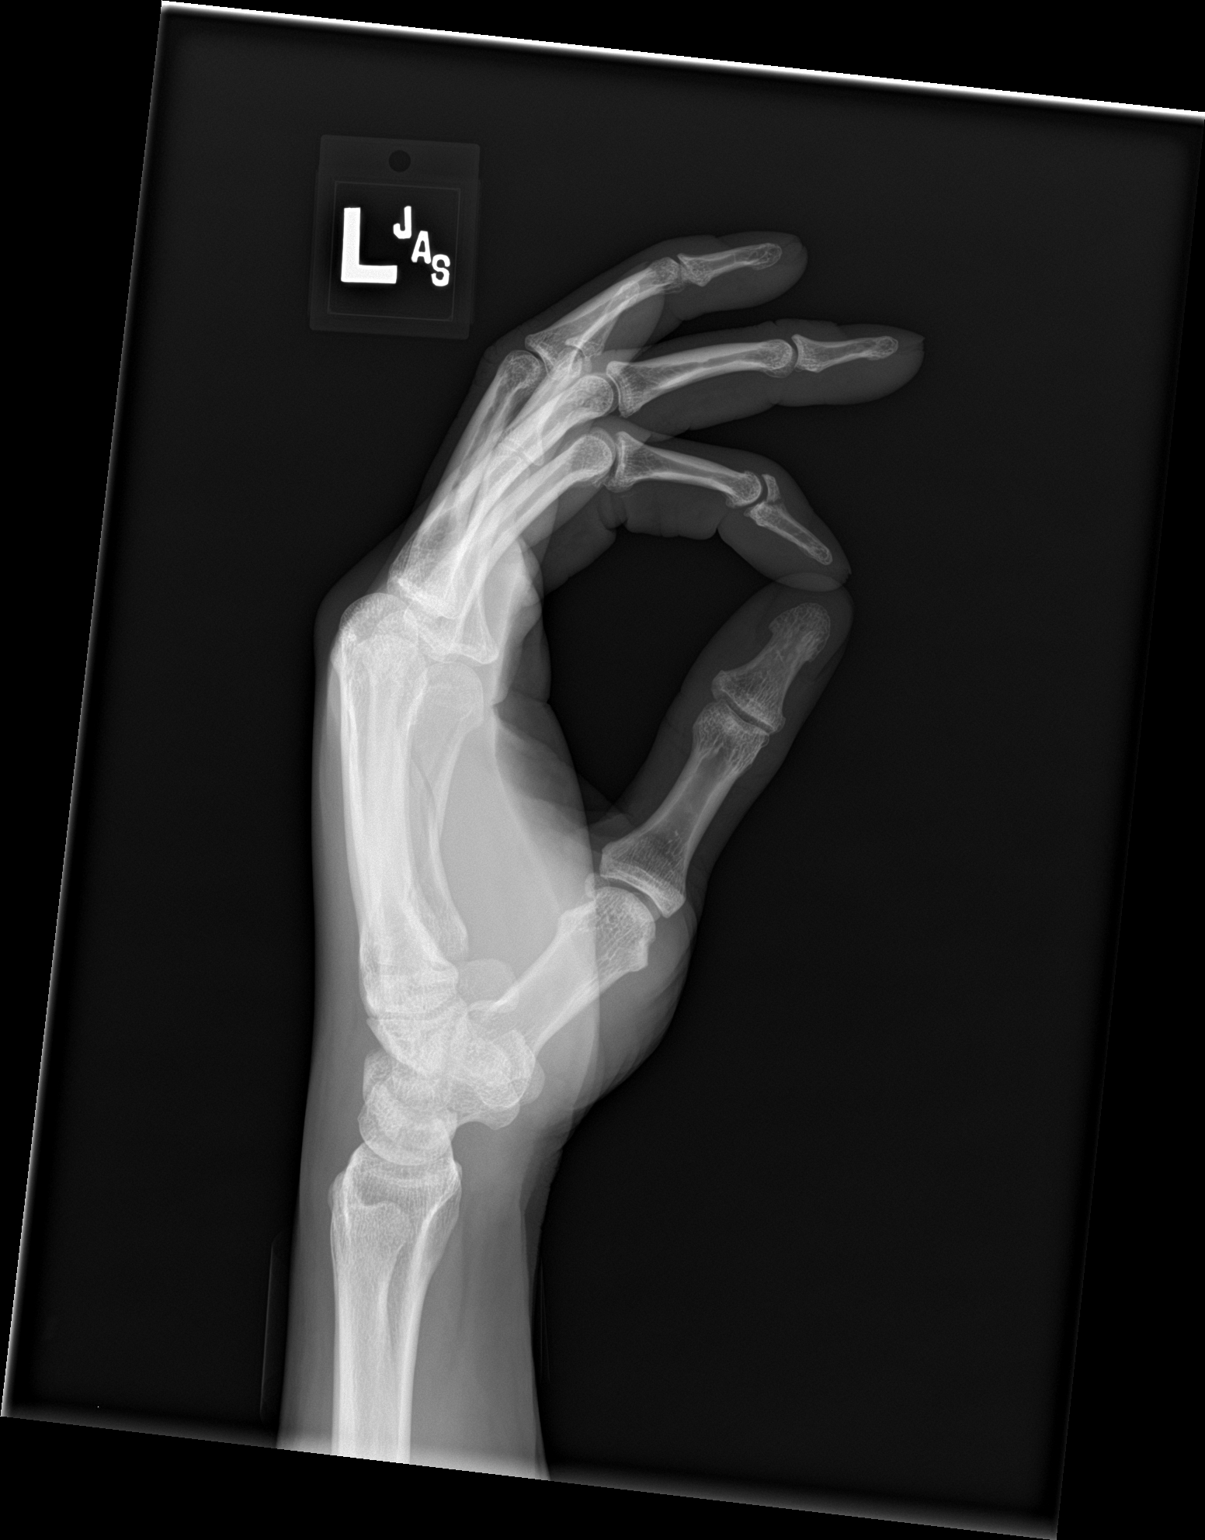

[3 of 3 positions shown; findings below may reference images not displayed]

FINDINGS: Mildly displaced and possibly comminuted oblique fracture is seen
involving the fifth metacarpal. This appears to be closed and
posttraumatic. No other fracture or bony abnormality is noted. Joint
spaces appear to be intact. No soft tissue abnormality is noted.
IMPRESSION: Mildly displaced and possibly comminuted fifth metacarpal fracture.
